# Patient Record
Sex: Female | Born: 1965 | Race: Black or African American | Hispanic: No
Health system: Southern US, Community
[De-identification: ages and names within clinical notes are randomized; demographics above are authoritative.]

## PROBLEM LIST (undated history)

## (undated) DIAGNOSIS — E161 Other hypoglycemia: Secondary | ICD-10-CM

## (undated) DIAGNOSIS — F410 Panic disorder [episodic paroxysmal anxiety] without agoraphobia: Secondary | ICD-10-CM

## (undated) DIAGNOSIS — N83209 Unspecified ovarian cyst, unspecified side: Secondary | ICD-10-CM

---

## 1998-03-10 ENCOUNTER — Inpatient Hospital Stay (HOSPITAL_COMMUNITY): Admission: AD | Admit: 1998-03-10 | Discharge: 1998-03-10 | Payer: Self-pay | Admitting: Obstetrics and Gynecology

## 1999-01-01 ENCOUNTER — Other Ambulatory Visit: Admission: RE | Admit: 1999-01-01 | Discharge: 1999-01-01 | Payer: Self-pay | Admitting: Obstetrics and Gynecology

## 1999-08-12 ENCOUNTER — Inpatient Hospital Stay (HOSPITAL_COMMUNITY): Admission: AD | Admit: 1999-08-12 | Discharge: 1999-08-12 | Payer: Self-pay | Admitting: Obstetrics and Gynecology

## 1999-09-16 ENCOUNTER — Emergency Department (HOSPITAL_COMMUNITY): Admission: EM | Admit: 1999-09-16 | Discharge: 1999-09-16 | Payer: Self-pay | Admitting: Emergency Medicine

## 2000-05-12 ENCOUNTER — Other Ambulatory Visit: Admission: RE | Admit: 2000-05-12 | Discharge: 2000-05-12 | Payer: Self-pay | Admitting: Obstetrics and Gynecology

## 2000-07-06 ENCOUNTER — Encounter: Payer: Self-pay | Admitting: Obstetrics and Gynecology

## 2000-07-06 ENCOUNTER — Inpatient Hospital Stay (HOSPITAL_COMMUNITY): Admission: AD | Admit: 2000-07-06 | Discharge: 2000-07-06 | Payer: Self-pay | Admitting: Obstetrics and Gynecology

## 2001-01-28 ENCOUNTER — Emergency Department (HOSPITAL_COMMUNITY): Admission: EM | Admit: 2001-01-28 | Discharge: 2001-01-28 | Payer: Self-pay | Admitting: Emergency Medicine

## 2001-04-22 ENCOUNTER — Inpatient Hospital Stay (HOSPITAL_COMMUNITY): Admission: AD | Admit: 2001-04-22 | Discharge: 2001-04-22 | Payer: Self-pay | Admitting: Obstetrics and Gynecology

## 2001-08-18 ENCOUNTER — Encounter: Admission: RE | Admit: 2001-08-18 | Discharge: 2001-08-18 | Payer: Self-pay | Admitting: Internal Medicine

## 2001-08-18 ENCOUNTER — Encounter: Payer: Self-pay | Admitting: Internal Medicine

## 2001-09-20 ENCOUNTER — Encounter (INDEPENDENT_AMBULATORY_CARE_PROVIDER_SITE_OTHER): Payer: Self-pay | Admitting: Specialist

## 2001-09-20 ENCOUNTER — Inpatient Hospital Stay (HOSPITAL_COMMUNITY): Admission: RE | Admit: 2001-09-20 | Discharge: 2001-09-22 | Payer: Self-pay | Admitting: Obstetrics and Gynecology

## 2002-07-20 ENCOUNTER — Encounter: Payer: Self-pay | Admitting: Obstetrics and Gynecology

## 2002-07-20 ENCOUNTER — Encounter: Admission: RE | Admit: 2002-07-20 | Discharge: 2002-07-20 | Payer: Self-pay | Admitting: Obstetrics and Gynecology

## 2005-10-28 ENCOUNTER — Emergency Department (HOSPITAL_COMMUNITY): Admission: EM | Admit: 2005-10-28 | Discharge: 2005-10-28 | Payer: Self-pay | Admitting: Emergency Medicine

## 2005-11-03 ENCOUNTER — Encounter: Admission: RE | Admit: 2005-11-03 | Discharge: 2005-11-03 | Payer: Self-pay | Admitting: Internal Medicine

## 2005-11-13 ENCOUNTER — Encounter: Admission: RE | Admit: 2005-11-13 | Discharge: 2005-11-13 | Payer: Self-pay | Admitting: Internal Medicine

## 2006-01-03 ENCOUNTER — Emergency Department (HOSPITAL_COMMUNITY): Admission: EM | Admit: 2006-01-03 | Discharge: 2006-01-03 | Payer: Self-pay | Admitting: Emergency Medicine

## 2006-03-26 ENCOUNTER — Emergency Department (HOSPITAL_COMMUNITY): Admission: EM | Admit: 2006-03-26 | Discharge: 2006-03-26 | Payer: Self-pay | Admitting: Emergency Medicine

## 2006-07-22 ENCOUNTER — Encounter: Admission: RE | Admit: 2006-07-22 | Discharge: 2006-07-22 | Payer: Self-pay | Admitting: Internal Medicine

## 2008-02-17 ENCOUNTER — Ambulatory Visit (HOSPITAL_COMMUNITY): Admission: RE | Admit: 2008-02-17 | Discharge: 2008-02-17 | Payer: Self-pay | Admitting: Obstetrics

## 2008-02-22 ENCOUNTER — Encounter: Admission: RE | Admit: 2008-02-22 | Discharge: 2008-02-22 | Payer: Self-pay | Admitting: Internal Medicine

## 2009-03-19 ENCOUNTER — Emergency Department (HOSPITAL_COMMUNITY): Admission: EM | Admit: 2009-03-19 | Discharge: 2009-03-19 | Payer: Self-pay | Admitting: Emergency Medicine

## 2009-03-25 ENCOUNTER — Emergency Department (HOSPITAL_COMMUNITY): Admission: EM | Admit: 2009-03-25 | Discharge: 2009-03-25 | Payer: Self-pay | Admitting: Emergency Medicine

## 2009-05-15 ENCOUNTER — Ambulatory Visit: Payer: Self-pay | Admitting: Family Medicine

## 2009-05-21 ENCOUNTER — Ambulatory Visit: Payer: Self-pay | Admitting: Family Medicine

## 2009-11-29 ENCOUNTER — Ambulatory Visit: Payer: Self-pay | Admitting: Family Medicine

## 2009-12-20 ENCOUNTER — Emergency Department: Payer: Self-pay | Admitting: Emergency Medicine

## 2010-04-11 ENCOUNTER — Ambulatory Visit: Payer: Self-pay | Admitting: Family Medicine

## 2010-04-11 DIAGNOSIS — R809 Proteinuria, unspecified: Secondary | ICD-10-CM | POA: Insufficient documentation

## 2010-04-11 DIAGNOSIS — T7421XA Adult sexual abuse, confirmed, initial encounter: Secondary | ICD-10-CM

## 2010-04-11 DIAGNOSIS — F4323 Adjustment disorder with mixed anxiety and depressed mood: Secondary | ICD-10-CM

## 2010-04-11 DIAGNOSIS — I10 Essential (primary) hypertension: Secondary | ICD-10-CM | POA: Insufficient documentation

## 2010-04-11 DIAGNOSIS — F411 Generalized anxiety disorder: Secondary | ICD-10-CM

## 2010-04-12 ENCOUNTER — Encounter: Payer: Self-pay | Admitting: Family Medicine

## 2010-04-13 ENCOUNTER — Telehealth: Payer: Self-pay | Admitting: Family Medicine

## 2010-04-13 ENCOUNTER — Encounter: Payer: Self-pay | Admitting: Family Medicine

## 2010-04-15 ENCOUNTER — Encounter: Payer: Self-pay | Admitting: Family Medicine

## 2010-04-16 ENCOUNTER — Encounter (INDEPENDENT_AMBULATORY_CARE_PROVIDER_SITE_OTHER): Payer: Self-pay

## 2010-04-16 ENCOUNTER — Encounter: Payer: Self-pay | Admitting: Family Medicine

## 2010-05-23 ENCOUNTER — Ambulatory Visit: Payer: Self-pay | Admitting: Family Medicine

## 2010-06-01 ENCOUNTER — Ambulatory Visit: Admit: 2010-06-01 | Payer: Self-pay | Admitting: Family Medicine

## 2010-06-29 LAB — CONVERTED CEMR LAB
AST: 14 units/L (ref 0–37)
Albumin: 4.3 g/dL (ref 3.5–5.2)
CO2: 20 meq/L (ref 19–32)
Calcium: 9.4 mg/dL (ref 8.4–10.5)
Cholesterol: 209 mg/dL — ABNORMAL HIGH (ref 0–200)
HDL: 45 mg/dL (ref >39)
Lymphocytes Relative: 42 % (ref 12–46)
Lymphs Abs: 2.8 10*3/uL (ref 0.7–4.0)
MCHC: 33.2 g/dL (ref 30.0–36.0)
Monocytes Absolute: 0.5 10*3/uL (ref 0.1–1.0)
Monocytes Relative: 7 % (ref 3–12)
Neutro Abs: 3.1 10*3/uL (ref 1.7–7.7)
Neutrophils Relative %: 47 % (ref 43–77)
Platelets: 250 10*3/uL (ref 150–400)
RBC: 5.18 M/uL — ABNORMAL HIGH (ref 3.87–5.11)
RDW: 13.9 % (ref 11.5–15.5)
TSH: 1.645 microintl units/mL (ref 0.350–4.500)
Triglycerides: 77 mg/dL (ref <150)
Vit D, 25-Hydroxy: 20 ng/mL — ABNORMAL LOW (ref 30–89)
WBC: 6.6 10*3/uL (ref 4.0–10.5)

## 2010-07-01 NOTE — Progress Notes (Signed)
Summary: CLINICAL LIST UPDATE      New Allergies: ! AMOXICILLIN ! BIAXIN ! CELEXA New Allergies: ! AMOXICILLIN ! BIAXIN ! CELEXA
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
## 2010-07-01 NOTE — Letter (Signed)
Summary: Handout Printed  Printed Handout:  - Depression, Adolescent and Adult 

## 2010-07-01 NOTE — Letter (Signed)
Summary: Out of Work  [facility]  [address]   [place], [place] [address]   Phone: [phone]  Fax: [phone]    April 11, 2010   Employee:  AUDRYANA HOCKENBERRY    To Whom It May Concern:   For Medical reasons, please excuse the above named employee from work for the following dates:  Start:   April 11, 2010  End:   April 15, 2010  If you need additional information, please feel free to contact our office.         Sincerely,    Standley Dakins MD  Appended Document: Out of Work A representative from the patient's place of employment called to verify that the patient had been seen today and that we had authorized, prepared and faxed the note from the clinic.   He was told that the note indeed was sent from this clinic after the patient had been seen.  Rodney Langton, M.D., F.A.A.F.P.

## 2010-07-01 NOTE — Progress Notes (Signed)
Summary: medical records  medical records   Imported By: Dorna Leitz 04/15/2010 17:06:50  _____________________________________________________________________  External Attachment:    Type:   Image     Comment:   External Document

## 2010-07-01 NOTE — Miscellaneous (Signed)
  Clinical Lists Changes  Medications: Added new medication of CLEOCIN 300 MG CAPS (CLINDAMYCIN HCL) take 1 by mouth every 12 hours - Signed Removed medication of SULFAMETHOXAZOLE-TMP DS 800-160 MG TABS (SULFAMETHOXAZOLE-TRIMETHOPRIM) take 1 by mouth two times a day until completed Rx of CLEOCIN 300 MG CAPS (CLINDAMYCIN HCL) take 1 by mouth every 12 hours;  #14 x 0;  Signed;  Entered by: Standley Dakins MD;  Authorized by: Standley Dakins MD;  Method used: Electronically to [facility]. [phone]*, [address], [place], [place], [place]  [address], Ph: [phone], Fax: [phone] Allergies: Changed allergy or adverse reaction from AMOXICILLIN to AMOXICILLIN    Prescriptions: CLEOCIN 300 MG CAPS (CLINDAMYCIN HCL) take 1 by mouth every 12 hours  #14 x 0   Entered and Authorized by:   Standley Dakins MD   Signed by:   Standley Dakins MD on 04/13/2010   Method used:   Electronically to        [facility] [phone]* (retail)       [address]       [place], [place]  [address]       Ph: [phone]       Fax: [phone]   RxID:   [id number]

## 2010-07-01 NOTE — Letter (Signed)
Summary: Out of Work  [facility]  [address]   [place], [place] [address]   Phone: [phone]  Fax: [phone]    April 16, 2010   Employee:  SHARLYNE KOENEMAN    To Whom It May Concern:   For Medical reasons, please excuse the above named employee from work for the following dates:  Start:04/16/2010    End:04/21/2010    If you need additional information, please feel free to contact our office.         Sincerely,    Levonne Spiller EMT-P  Appended Document: Out of Work patient in for recheck of blood pressure bp 134/74;pulse 64 states she feels better

## 2010-07-01 NOTE — Letter (Signed)
Summary: OUT OF WORK  OUT OF WORK   Imported By: Rosine Beat 04/16/2010 16:41:40  _____________________________________________________________________  External Attachment:    Type:   Image     Comment:   External Document

## 2010-07-16 ENCOUNTER — Telehealth: Payer: Self-pay | Admitting: Family Medicine

## 2010-07-18 ENCOUNTER — Encounter: Payer: Self-pay | Admitting: Internal Medicine

## 2010-07-18 ENCOUNTER — Ambulatory Visit: Payer: Self-pay | Admitting: Internal Medicine

## 2010-07-18 DIAGNOSIS — G44229 Chronic tension-type headache, not intractable: Secondary | ICD-10-CM | POA: Insufficient documentation

## 2010-07-18 DIAGNOSIS — G44209 Tension-type headache, unspecified, not intractable: Secondary | ICD-10-CM

## 2010-07-23 NOTE — Progress Notes (Signed)
  Phone Note Call from Patient   Summary of Call: Pt reporting that she is having a recurrence of the exact same symptoms she had when she had a UTI several months ago.  She is requesting an RX be called in to [facility].  Initial call taken by: Dorna Leitz  Follow-up for Phone Call        Will call in RX this time only but patient must be advised that she will need to come in to have urine tested in the future and if this doesn't clear up her symptoms.   Follow-up by: Standley Dakins MD,  July 16, 2010 4:21 PM  Additional Follow-up for Phone Call Additional follow up Details #1::        The patient verbalized clear understanding of above.   Additional Follow-up by: Dorna Leitz    New/Updated Medications: CLINDAMYCIN HCL 300 MG CAPS (CLINDAMYCIN HCL) take 1 by mouth two times a day until completed Prescriptions: CLINDAMYCIN HCL 300 MG CAPS (CLINDAMYCIN HCL) take 1 by mouth two times a day until completed  #14 x 0   Entered and Authorized by:   Standley Dakins MD   Signed by:   Standley Dakins MD on 07/16/2010   Method used:   Faxed to ...       [facility] (retail)       [address]       [place], [place]    [place]       Ph: [phone]       Fax: [phone]   RxID:   [id number]  The patient says that she tolerated the clindamycin prescription given to her in the past well with no side effects.  Rodney Langton, MD, CDE, [employer]
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
## 2010-08-24 ENCOUNTER — Emergency Department: Payer: Self-pay | Admitting: Emergency Medicine

## 2010-10-17 NOTE — Discharge Summary (Signed)
[facility]  Patient:    Judith Fischer, FATE [facility] Visit Number: [id number] MRN: [id number]          Service Type: GYN Location: 9300 [id number] Attending Physician:  Leonard Schwartz Dictated by:   Henreitta Leber, P.A. Admit Date:  09/20/2001 Discharge Date: 09/22/2001                             Discharge Summary  DISCHARGE DIAGNOSES:          1. Fibroid uterus.                               2. Endometriosis.                               3. Dysmenorrhea.                               4. Dyspareunia.                               5. Menorrhagia.                               6. Anemia.                               7. Pelvic adhesions.                               8. Obesity (weight 221 pounds).  PROCEDURES:                   On the date of admission, the patient underwent a total abdominal hysterectomy with lysis of adhesions and drainage of a left ovarian cyst tolerating all procedures well.  HISTORY OF PRESENT ILLNESS:   Ms. Judith Fischer is a 45 year old female, gravida 0, who presents for a total abdominal hysterectomy because of menorrhagia, dysmenorrhea, fibroids, and severe anemia (hemoglobin as low as 8.8).  Please see patients dictated History & Physical examination for details.  PHYSICAL EXAMINATION:  VITAL SIGNS:                  Weight 221 pounds, height 5 feet 5 inches tall.  GENERAL:                      Exam within normal limits.  PELVIC:                       EG/BUS within normal limits.  Vagina is normal except for menstrual blood.  Cervix is nontender.  Uterus is approximately 12-weeks size.  Adnexa without masses.  Rectovaginal exam confirms.  HOSPITAL COURSE:              On the date of admission, the patient underwent aforementioned procedures tolerating them all well.  The patient was found to have a multiple fibroid uterus measuring 10 to 12-week size, pelvice adhesions, implants consistent with endometriosis, and left  ovarian corpus luteum cyst which was drained along with mobile-appearing left tube and  right tube with ovary.  Postoperative course was unremarkable with patient resuming bowel and bladder function by postop day #2 and being ready for discharge home.  Postop hemoglobin 9.1, preop hemoglobin 9.5.  DISCHARGE MEDICATIONS:        1. Vicodin 1 to 2 tablets every 4 to 6 hours                                  as needed for pain.                               2. Ibuprofen 600 mg 1 tablet with food every                                  6 hours for 5 days, then as needed.                               3. Phenergan 25 mg 1 tablet 4 times daily if                                  needed for nausea.                               4. Iron 325 mg 1 tablet twice daily for                                  6 weeks.                               5. Stool softeners 100 mg 1 tablet twice daily                                  until bowel movements are regular.  FOLLOWUP:                     The patient is to call [facility]cology for an appointment to have staples removed on September 26, 2001.  She is also at that time to schedule a 6-week postoperative exam with Dr. Leonard Schwartz.  DISCHARGE INSTRUCTIONS:       The patient was given a copy of [facility] postoperative instruction sheet.  She was further advised to avoid driving for two weeks, heavy lifting for four weeks, and intercourse for six weeks.  FINAL PATHOLOGY:              1) Right pelvic biopsy: Endometriosis, no evidence of malignancy.  2) Anterior cul-de-sac biopsy: Endometriosis, no evidence of malignancy.  3) Uterus and Cervix: Squamous metaplasia, no dysplasia identified.  Prolific endometrium, no evidence of hyperplasia or malignancy.  Leiomyomata of mucosal intramural and subserosal, uterine serosal fibrous adhesions.  No endometriosis or malignancy identified. Dictated  by:   Henreitta Leber, P.A. Attending Physician:  Leonard Schwartz DD:  10/11/01 TD:  10/13/01 Job: [id number]

## 2010-10-17 NOTE — H&P (Signed)
[facility]  Patient:    Judith Fischer, Judith Fischer [facility] Visit Number: [id number] MRN: [id number]          Service Type: Attending:  Janine Limbo, M.D. Dictated by:   Janine Limbo, M.D. Adm. Date:  09/20/01   CC:         Theressa Millard, M.D.   History and Physical  HISTORY OF PRESENT ILLNESS:   Ms. Sopheap Basic is a 45 year old female, gravida 0, who presents for a total abdominal hysterectomy because of menorrhagia, dysmenorrhea, fibroids, and severe anemia (hemoglobin as low as 8.8).  The patient has been followed at [facility] for many years with the above-mentioned complaints.  The patient had a diagnostic laparoscopy in May of 1996 and a myomectomy in September of 1996.  She was found to have fibroids as well as endometriosis.  She has been treated with hormonal therapy, nonsteroidal anti-inflammatory agents, and narcotic medications.  A repeat ultrasound has demonstrated that the fibroids have returned and that the uterus measures approximately 12.7 cm by 7.2 cm.  The ovaries appear normal.  The patients discomfort has progressed to the point that she has trouble working and performing her normal functions.  In addition, she suffers from pelvic pain and dyspareunia.  She is ready at this time to proceed with definitive therapy.  PAST MEDICAL HISTORY:         The patient denies hypertension and diabetes.  CURRENT MEDICATIONS:          Iron.  SOCIAL HISTORY:               The patient denies cigarette use, alcohol use, and recreational drug use.  ALLERGIES:                    None known.  REVIEW OF SYSTEMS:            Please see history of present illness.  FAMILY HISTORY:               Noncontributory.  PHYSICAL EXAMINATION:  VITAL SIGNS:                  Weight is 221 pounds.  Height is 5 feet 5 inches.  HEENT:                        Within normal limits.  CHEST:                         Clear.  HEART:                        Regular rate and rhythm.  BREASTS:                      Without masses.  ABDOMEN:                      Nontender.  EXTREMITIES:                  Within normal limits.  NEUROLOGICAL:                 Grossly normal.  PELVIC:                       External genitalia is normal.  Vagina is normal except for menstrual blood.  Cervix  is nontender.  Uterus is approximately 12 weeks size.  Adnexa shows no masses.  Rectovaginal exam confirms.  ASSESSMENT:                   1. Fibroid uterus.                               2. Endometriosis.                               3. Dysmenorrhea.                               4. Menorrhagia.                               5. Anemia.                               6. Dyspareunia.                               7. Obesity (weight 221 pounds).                               8. History of prior myomectomy.  PLAN:                         The patient has decided to proceed with definitive therapy.  We discussed alternative therapies including Lupron management, myomectomy, uterine artery embolization, uterine fibroid ablation, and possible vaginal hysterectomy.  She declined those options after carefully considering the risks and benefits.  She wants to proceed at this point with abdominal hysterectomy.  She understands the indications for her procedure and she accepts the risks of, but not limited to, anesthetic complications, bleeding, infections, and possible damage to the surrounding organs.Dictated by:   Janine Limbo, M.D. Attending:  Janine Limbo, M.D. DD:  09/18/01 TD:  09/18/01 Job: [id number]

## 2010-10-17 NOTE — Op Note (Signed)
[facility]  Patient:    Judith Fischer, Judith Fischer [facility] Visit Number: [id number] MRN: [id number]          Service Type: GYN Location: 9300 [id number] Attending Physician:  Leonard Schwartz Dictated by:   Janine Limbo, M.D. Proc. Date: 09/20/01 Admit Date:  09/20/2001   CC:         Theressa Millard, M.D.   Operative Report  PREOPERATIVE DIAGNOSES:       1. Fibroid uterus.                               2. Endometriosis.                               3. Dysmenorrhea.                               4. Menorrhagia.                               5. Anemia.                               6. Dyspareunia.                               7. Obesity (weight 221 pounds, height is 5 feet                                  5 inches).                               8. History of a prior myomectomy.  POSTOPERATIVE DIAGNOSES:      1. Fibroid uterus.                               2. Endometriosis.                               3. Dysmenorrhea.                               4. Menorrhagia.                               5. Anemia.                               6. Dyspareunia.                               7. Obesity (weight 221 pounds, height is 5 feet                                  5 inches).  8. History of a prior myomectomy.                               9. Adhesions.  PROCEDURE:                    Total abdominal hysterectomy with drainage of a left ovarian cyst.  SURGEON:                      Janine Limbo, M.D.  FIRST ASSISTANT:              Henreitta Leber, P.A.  ANESTHESIA:                   General.  INDICATIONS:                  The patient is a 45 year old female, gravida 0, who presents with the above-mentioned diagnoses.  Her discomfort has progressed to the point that she is unable to perform her duties at home and at work.  She wants to proceed with hysterectomy.  She understands the indications for her procedure  and she accepts the risks of (but not limited to) anesthetic complications, bleeding, infection and possible damage to surrounding organs.  The patient has requested that we perform her procedure abdominally rather than vaginally.  FINDINGS:                     The uterus was 12 weeks in size and contained multiple subserosal and intramural fibroids.  The left fallopian tube was scarred to the left ovary.  The left ovarian complex was adherent to the left posterior uterus.  There were filmy adhesions between the bowel and the posterior uterus.  The right fallopian tube and right ovary appeared normal. There were hyperpigmented lesions in the anterior cul-de-sac measuring less than 0.5 cm in size.  These were consistent with implants of endometriosis. The bowel and remaining abdominal contents appeared normal.  There was a corpus luteum cyst present on the left ovary.  DESCRIPTION OF PROCEDURE:     The patient was taken to the operating room, where a general anesthetic was given.  The patients abdomen, perineum and vagina were prepped with multiple layers of Betadine.  A Foley catheter was placed in the bladder.  The patient was sterilely draped.  A low transverse incision was made in the abdomen and carried sharply through the subcutaneous tissue, the fascia and the anterior peritoneum.  The pelvic structures were identified with findings as mentioned above.  The adhesions in the pelvis were lysed so that the anatomy could be reapproximated to a normal status.  An abdominal wall retractor was placed.  The bowel was packed cephalad.  The ureters were identified bilaterally and followed throughout their course in the pelvis.  They were felt to be away from our surgical areas.  The round ligaments were isolated bilaterally, cut, suture ligated and held to the side. A bladder flap was developed anteriorly.  The left upper pedicle was isolated, clamped, cut, sutured and tied securely.  An  identical procedure was carried out on the opposite side.  The uterine vessels were skeletonized bilaterally. Alternating from left to right, the uterine arteries, parametrial tissues, paracervical tissues and the vaginal angles were cut, sutured and tied securely.  The cervix was then transected from the upper vagina.  The uterus was removed and pictures were taken.  The vaginal cuff was then closed using figure-of-eight sutures.  The uterosacral ligaments were tied to the vaginal angles.  Hemostasis was achieved using figure-of-eight sutures of 3-0 Vicryl. The pelvis was then vigorously irrigated.  Hemostasis was thought to be adequate.  The cyst in the left ovary was opened and it was found to be a corpus luteum.  Figure-of-eight sutures of 3-0 Vicryl were placed for hemostasis.  A final check was made for hemostasis, and hemostasis was adequate throughout.  The instruments were removed.  The anterior peritoneum and the abdominal musculature were reapproximated in the midline using 3-0 Vicryl.  The fascia was closed using a running suture of 0 Vicryl, followed by three interrupted sutures of 0 Vicryl.  The subcutaneous layer was irrigated. Hemostasis was adequate.  The subcutaneous layer was closed using a running suture of 3-0 Vicryl.  The skin was reapproximated using skin staples.  The suture material used except where otherwise mentioned was 0 Vicryl.  Sponge, needle and instrument counts were correct on two occasions.  Estimated blood loss was 150 cc.  Estimated urine output was 650 cc.  The patients urine was noted to be clear.  The patient was awakened from her anesthetic and taken to the recovery room in stable condition. Dictated by:   Janine Limbo, M.D. Attending Physician:  Leonard Schwartz DD:  09/20/01 TD:  09/20/01 Job: [id number]

## 2011-08-19 ENCOUNTER — Encounter (HOSPITAL_COMMUNITY): Payer: Self-pay

## 2011-08-19 ENCOUNTER — Emergency Department (HOSPITAL_COMMUNITY)
Admission: EM | Admit: 2011-08-19 | Discharge: 2011-08-19 | Disposition: A | Discharge status: Home / Self Care | Payer: BC Managed Care – PPO | Attending: Emergency Medicine | Admitting: Emergency Medicine

## 2011-08-19 DIAGNOSIS — R197 Diarrhea, unspecified: Secondary | ICD-10-CM | POA: Insufficient documentation

## 2011-08-19 DIAGNOSIS — N390 Urinary tract infection, site not specified: Secondary | ICD-10-CM

## 2011-08-19 DIAGNOSIS — B9789 Other viral agents as the cause of diseases classified elsewhere: Secondary | ICD-10-CM | POA: Insufficient documentation

## 2011-08-19 DIAGNOSIS — R112 Nausea with vomiting, unspecified: Secondary | ICD-10-CM

## 2011-08-19 DIAGNOSIS — B349 Viral infection, unspecified: Secondary | ICD-10-CM

## 2011-08-19 HISTORY — DX: Unspecified ovarian cyst, unspecified side: N83.209

## 2011-08-19 HISTORY — DX: Panic disorder (episodic paroxysmal anxiety): F41.0

## 2011-08-19 HISTORY — DX: Other hypoglycemia: E16.1

## 2011-08-19 LAB — URINALYSIS, ROUTINE W REFLEX MICROSCOPIC
Bilirubin Urine: NEGATIVE
Glucose, UA: NEGATIVE mg/dL
Hgb urine dipstick: NEGATIVE
Ketones, ur: NEGATIVE mg/dL
Nitrite: NEGATIVE
Protein, ur: 30 mg/dL — AB
Specific Gravity, Urine: 1.029 (ref 1.005–1.030)
Urobilinogen, UA: 1 mg/dL (ref 0.0–1.0)
pH: 6.5 (ref 5.0–8.0)

## 2011-08-19 LAB — POCT I-STAT, CHEM 8
Calcium, Ion: 1.15 mmol/L (ref 1.12–1.32)
Chloride: 105 mEq/L (ref 96–112)
Glucose, Bld: 123 mg/dL — ABNORMAL HIGH (ref 70–99)
Potassium: 4 mEq/L (ref 3.5–5.1)
TCO2: 24 mmol/L (ref 0–100)

## 2011-08-19 LAB — URINE MICROSCOPIC-ADD ON

## 2011-08-19 MED ORDER — ONDANSETRON HCL 4 MG/2ML IJ SOLN: 4.0000 mg | Freq: Once | INTRAMUSCULAR | Status: DC

## 2011-08-19 MED ORDER — FAMOTIDINE 20 MG PO TABS: 20.0000 mg | ORAL_TABLET | Freq: Once | ORAL | Status: DC

## 2011-08-19 MED ORDER — SODIUM CHLORIDE 0.9 % IV BOLUS (SEPSIS)
1000.0000 mL | Freq: Once | INTRAVENOUS | Status: AC
  Administered 2011-08-19: 500 mL via INTRAVENOUS

## 2011-08-19 MED ORDER — ONDANSETRON HCL 4 MG/2ML IJ SOLN
4.0000 mg | Freq: Once | INTRAMUSCULAR | Status: AC
  Administered 2011-08-19: 4 mg via INTRAVENOUS
  Filled 2011-08-19: qty 2

## 2011-08-19 MED ORDER — SODIUM CHLORIDE 0.9 % IV SOLN
1000.0000 mL | INTRAVENOUS | Status: DC
  Administered 2011-08-19: 1000 mL via INTRAVENOUS

## 2011-08-19 MED ORDER — NITROFURANTOIN MONOHYD MACRO 100 MG PO CAPS: 100.0000 mg | ORAL_CAPSULE | Freq: Two times a day (BID) | ORAL | Status: AC

## 2011-08-19 MED ORDER — PROMETHAZINE HCL 25 MG/ML IJ SOLN
12.5000 mg | Freq: Once | INTRAMUSCULAR | Status: AC
  Administered 2011-08-19: 25 mg via INTRAVENOUS
  Filled 2011-08-19: qty 1

## 2011-08-19 MED ORDER — PREDNISONE 20 MG PO TABS: 60.0000 mg | ORAL_TABLET | Freq: Once | ORAL | Status: DC

## 2011-08-19 MED ORDER — PROMETHAZINE HCL 25 MG PO TABS: 25.0000 mg | ORAL_TABLET | Freq: Four times a day (QID) | ORAL | Status: AC | PRN

## 2011-08-19 MED ORDER — DIPHENHYDRAMINE HCL 50 MG/ML IJ SOLN: 50.0000 mg | Freq: Once | INTRAMUSCULAR | Status: DC

## 2011-08-19 NOTE — ED Provider Notes (Signed)
History     CSN: [id number]  Arrival date & time 08/19/11  0101   First MD Initiated Contact with Patient 08/19/11 [phone]      Chief Complaint  Patient presents with  . Emesis     Patient is a 46 y.o. female presenting with vomiting. The history is provided by the patient.  Emesis  This is a new problem. The current episode started 6 to 12 hours ago. The problem occurs 5 to 10 times per day. The problem has been gradually worsening. The emesis has an appearance of stomach contents. There has been no fever. Associated symptoms include diarrhea. Pertinent negatives include no abdominal pain, no chills and no fever.   patient reports onset of nausea vomiting and diarrhea at approximately 9:30 last evening. States that she has had approximately 7 episodes of vomiting and 4 episodes of diarrhea since onset of symptoms.. Patient denies significant abdominal pain or known fever. She also denies cough shortness of breath chest pain or other symptoms. Patient also reports that her urine has had a very foul-smelling to that for 2-3 weeks. She states that she has been attempting to drink lots of cranberry juice as she has been suspicious that she may have a UTI.  Past Medical History  Diagnosis Date  . Panic attacks   . Hypoglycemic reaction   . Ovarian cyst     History reviewed. No pertinent past surgical history.  History reviewed. No pertinent family history.  History  Substance Use Topics  . Smoking status: Not on file  . Smokeless tobacco: Not on file  . Alcohol Use: No    OB History    Grav Para Term Preterm Abortions TAB SAB Ect Mult Living                  Review of Systems  Constitutional: Negative.  Negative for fever and chills.  HENT: Negative.   Eyes: Negative.   Respiratory: Negative.   Cardiovascular: Negative.   Gastrointestinal: Positive for vomiting and diarrhea. Negative for abdominal pain.  Genitourinary: Negative.   Musculoskeletal: Negative.   Skin:  Negative.   Neurological: Negative.   Hematological: Negative.   Psychiatric/Behavioral: Negative.     Allergies  Amoxicillin; Citalopram hydrobromide; and Clarithromycin  Home Medications  No current outpatient prescriptions on file.  BP 145/79  Pulse 81  Temp(Src) 98.9 F (37.2 C) (Oral)  Resp 16  SpO2 99%  Physical Exam  Constitutional: She is oriented to person, place, and time. She appears well-developed and well-nourished.  HENT:  Head: Normocephalic and atraumatic.  Eyes: Conjunctivae are normal.  Neck: Neck supple.  Cardiovascular: Normal rate and regular rhythm.   Pulmonary/Chest: Effort normal and breath sounds normal.  Abdominal: Soft. Bowel sounds are normal.  Musculoskeletal: Normal range of motion.  Neurological: She is alert and oriented to person, place, and time.  Skin: Skin is warm and dry. No erythema.  Psychiatric: She has a normal mood and affect.    ED Course  Procedures   Pt feeling much better after IVF's and medication. No further N/V/D. Findings and clinical impression discussed w/ pt. Will plan for d/c home w/ tx for UTI and short course of med for nausea and encourage pt to f/u w/ PCP. Referrals provided. Pt agreeable w/ plan.  Labs Reviewed  URINALYSIS, ROUTINE W REFLEX MICROSCOPIC - Abnormal; Notable for the following:    APPearance CLOUDY (*)    Protein, ur 30 (*)    Leukocytes, UA SMALL (*)  All other components within normal limits  POCT I-STAT, CHEM 8 - Abnormal; Notable for the following:    Glucose, Bld 123 (*)    Hemoglobin 16.3 (*)    HCT 48.0 (*)    All other components within normal limits  URINE MICROSCOPIC-ADD ON - Abnormal; Notable for the following:    Squamous Epithelial / LPF MANY (*)    Bacteria, UA FEW (*)    All other components within normal limits   No results found.   No diagnosis found.    MDM  HPI/PE and clinical findings c/w 1. N/V/D (Likely viral, chemistry panel WNL, pt feeling better and there  have been no further episodes of N/V/D, pt denies focal abd pain, tolerating PO fluids at d/c). 2. UTI        Leanne Chang, NP 08/22/11 0725  Roma Kayser Schorr, NP 08/22/11 0725   Medical screening examination/treatment/procedure(s) were performed by non-physician practitioner and as supervising physician I was immediately available for consultation/collaboration.  Sunnie Nielsen, MD 08/23/11 [phone]

## 2011-08-19 NOTE — Discharge Instructions (Signed)
Please review the instructions below. You were seen in the emergency department tonight for your onset of nausea, vomiting and diarrhea. You received IV fluids and medication and admit to feeling better. Your blood work was normal. The likely source of your nausea, vomiting and diarrhea is a viral syndrome. Your urine test does show that you have a urinary tract infection. We are treating this infection with an antibiotic. Please take as directed and be sure to complete the antibiotic. We have also prescribed a short course of medication for nausea. Please take as directed. If your diarrhea persist you can get over-the-counter Immodium and take as directed. We have provided the "[facility]" number above and the resource list below, as discussed, to assist you in getting established with a primary care physician. Return if symptoms worsen, otherwise follow up with a primary care physician as soon as can be arranged     B.R.A.T. Diet Your doctor has recommended the B.R.A.T. diet for you or your child until the condition improves. This is often used to help control diarrhea and vomiting symptoms. If you or your child can tolerate clear liquids, you may have:  Bananas.   Rice.   Applesauce.   Toast (and other simple starches such as crackers, potatoes, noodles).  Be sure to avoid dairy products, meats, and fatty foods until symptoms are better. Fruit juices such as apple, grape, and prune juice can make diarrhea worse. Avoid these. Continue this diet for 2 days or as instructed by your caregiver. Document Released: 05/18/2005 Document Revised: 05/07/2011 Document Reviewed: 11/04/2006 .Nausea and Vomiting Nausea means you feel sick to your stomach. Throwing up (vomiting) is a reflex where stomach contents come out of your mouth. HOME CARE   Take medicine as told by your doctor.   Do not force yourself to eat. However, you do need to drink fluids.   If you feel like eating, eat a normal diet  as told by your doctor.   Eat rice, wheat, potatoes, bread, lean meats, yogurt, fruits, and vegetables.   Avoid high-fat foods.   Drink enough fluids to keep your pee (urine) clear or pale yellow.   Ask your doctor how to replace body fluid losses (rehydrate). Signs of body fluid loss (dehydration) include:   Feeling very thirsty.   Dry lips and mouth.   Feeling dizzy.   Dark pee.   Peeing less than normal.   Feeling confused.   Fast breathing or heart rate.  GET HELP RIGHT AWAY IF:   You have blood in your throw up.   You have black or bloody poop (stool).   You have a bad headache or stiff neck.   You feel confused.   You have bad belly (abdominal) pain.   You have chest pain or trouble breathing.   You do not pee at least once every 8 hours.   You have cold, clammy skin.   You keep throwing up after 24 to 48 hours.   You have a fever.  MAKE SURE YOU:   Understand these instructions.   Will watch your condition.   Will get help right away if you are not doing well or get worse.  Document Released: 11/04/2007 Document Revised: 05/07/2011 Document Reviewed: 10/17/2010 .Urinary Tract Infection A urinary tract infection (UTI) is often caused by a germ (bacteria). A UTI is usually helped with medicine (antibiotics) that kills germs. Take all the medicine until it is gone. Do this even if you are feeling better. You are  usually better in 7 to 10 days. HOME CARE   Drink enough water and fluids to keep your pee (urine) clear or pale yellow. Drink:   Cranberry juice.   Water.   Avoid:   Caffeine.   Tea.   Bubbly (carbonated) drinks.   Alcohol.   Only take medicine as told by your doctor.   To prevent further infections:   Pee often.   After pooping (bowel movement), women should wipe from front to back. Use each tissue only once.   Pee before and after having sex (intercourse).  Ask your doctor when your test results will be ready. Make sure  you follow up and get your test results.  GET HELP RIGHT AWAY IF:   There is very bad back pain or lower belly (abdominal) pain.   You get the chills.   You have a fever.   Your baby is older than 3 months with a rectal temperature of 102 F (38.9 C) or higher.   Your baby is 53 months old or younger with a rectal temperature of 100.4 F (38 C) or higher.   You feel sick to your stomach (nauseous) or throw up (vomit).   There is continued burning with peeing.   Your problems are not better in 3 days. Return sooner if you are getting worse.  MAKE SURE YOU:   Understand these instructions.   Will watch your condition.   Will get help right away if you are not doing well or get worse.  Document Released: 11/04/2007 Document Revised: 05/07/2011 Document Reviewed: 11/04/2007 Viral Syndrome You or your child has Viral Syndrome. It is the most common infection causing "colds" and infections in the nose, throat, sinuses, and breathing tubes. Sometimes the infection causes nausea, vomiting, or diarrhea. The germ that causes the infection is a virus. No antibiotic or other medicine will kill it. There are medicines that you can take to make you or your child more comfortable.  HOME CARE INSTRUCTIONS   Rest in bed until you start to feel better.   If you have diarrhea or vomiting, eat small amounts of crackers and toast. Soup is helpful.   Do not give aspirin or medicine that contains aspirin to children.   Only take over-the-counter or prescription medicines for pain, discomfort, or fever as directed by your caregiver.  SEEK IMMEDIATE MEDICAL CARE IF:   You or your child has not improved within one week.   You or your child has pain that is not at least partially relieved by over-the-counter medicine.   Thick, colored mucus or blood is coughed up.   Discharge from the nose becomes thick yellow or green.   Diarrhea or vomiting gets worse.   There is any major change in your or  your child's condition.   You or your child develops a skin rash, stiff neck, severe headache, or are unable to hold down food or fluid.   You or your child has an oral temperature above 102 F (38.9 C), not controlled by medicine.   Your baby is older than 3 months with a rectal temperature of 102 F (38.9 C) or higher.   Your baby is 48 months old or younger with a rectal temperature of 100.4 F (38 C) or higher.  Document Released: 05/03/2006 Document Revised: 05/07/2011 Document Reviewed: 05/04/2007 [facility] Patient Information 2012 [place], [place].

## 2011-08-19 NOTE — ED Notes (Signed)
Pt states that she started vomiting ad having diarrhea about 9:30 tonight,

## 2011-11-05 ENCOUNTER — Other Ambulatory Visit: Payer: Self-pay

## 2011-11-05 ENCOUNTER — Encounter (HOSPITAL_COMMUNITY): Payer: Self-pay | Admitting: *Deleted

## 2011-11-05 ENCOUNTER — Emergency Department (HOSPITAL_COMMUNITY)
Admission: EM | Admit: 2011-11-05 | Discharge: 2011-11-05 | Disposition: A | Discharge status: Home / Self Care | Payer: 59 | Attending: Emergency Medicine | Admitting: Emergency Medicine

## 2011-11-05 DIAGNOSIS — R079 Chest pain, unspecified: Secondary | ICD-10-CM | POA: Insufficient documentation

## 2011-11-05 DIAGNOSIS — M25519 Pain in unspecified shoulder: Secondary | ICD-10-CM | POA: Insufficient documentation

## 2011-11-05 DIAGNOSIS — M25512 Pain in left shoulder: Secondary | ICD-10-CM

## 2011-11-05 DIAGNOSIS — F410 Panic disorder [episodic paroxysmal anxiety] without agoraphobia: Secondary | ICD-10-CM | POA: Insufficient documentation

## 2011-11-05 MED ORDER — IBUPROFEN 800 MG PO TABS: 800.0000 mg | ORAL_TABLET | Freq: Three times a day (TID) | ORAL | Status: AC

## 2011-11-05 MED ORDER — CYCLOBENZAPRINE HCL 10 MG PO TABS: 10.0000 mg | ORAL_TABLET | Freq: Two times a day (BID) | ORAL | Status: AC | PRN

## 2011-11-05 NOTE — Discharge Instructions (Signed)

## 2011-11-05 NOTE — ED Notes (Signed)
Pt states she started doing push ups 2 weeks ago. Now L shoulder sore. ROM intact. Pt states pain is worse with mvmt. Pt states she had chest pain earlier, but it has resolved now. Pt admits to history of anxiety.

## 2011-11-05 NOTE — ED Notes (Signed)
Pt c/o left shoulder pain since Monday; worse today; radiating from neck down arm

## 2011-11-05 NOTE — ED Provider Notes (Signed)
History     CSN: [id number]  Arrival date & time 11/05/11  2009   First MD Initiated Contact with Patient 11/05/11 2238      Chief Complaint  Patient presents with  . Shoulder Pain    (Consider location/radiation/quality/duration/timing/severity/associated sxs/prior treatment) HPI  46 year old female with hx of anxiety and no prior history of heart disease presents complaining of left shoulder pain. Patient states for the past 4 days she has had intermittent bouts of pain to her left shoulder. Pain is described as a burning and sharp sensation worsening with turning her neck, or with arm movement.  Onset is gradual, intermittent, and alleviate with rest.  She also experienced 2 bouts of sharp chest pain to her midchest.  Pain lasting only seconds, non exertional, and resolved without intervention.  Denies fever, rash, numbness or weakness.  Denies recent trauma.  Does admits to start performing push up and also moving furniture the week before.  Pt has tried taking 800mg  of ibuprofen once yesterday which provides some relief.    Past Medical History  Diagnosis Date  . Panic attacks   . Hypoglycemic reaction   . Ovarian cyst     History reviewed. No pertinent past surgical history.  History reviewed. No pertinent family history.  History  Substance Use Topics  . Smoking status: Not on file  . Smokeless tobacco: Not on file  . Alcohol Use: No    OB History    Grav Para Term Preterm Abortions TAB SAB Ect Mult Living                  Review of Systems  Constitutional: Negative for fever.  Cardiovascular: Positive for chest pain.  Musculoskeletal: Negative for back pain and joint swelling.  Skin: Negative for rash.  Neurological: Negative for numbness.    Allergies  Amoxicillin; Celebrex; Citalopram hydrobromide; and Clarithromycin  Home Medications   Current Outpatient Rx  Name Route Sig Dispense Refill  . LORAZEPAM 2 MG PO TABS Oral Take 1-2 mg by mouth every 8  (eight) hours as needed. For anxiety.    . OMEPRAZOLE 20 MG PO CPDR Oral Take 20 mg by mouth daily.    . SERTRALINE HCL 100 MG PO TABS Oral Take 100 mg by mouth daily.      BP 143/84  Pulse 65  Temp(Src) 98.8 F (37.1 C) (Oral)  Resp 18  Wt 230 lb 8 oz (104.554 kg)  SpO2 100%  Physical Exam  Nursing note and vitals reviewed. Constitutional: She appears well-developed and well-nourished. No distress.  HENT:  Head: Normocephalic and atraumatic.  Eyes: Conjunctivae are normal.  Neck: Normal range of motion. Neck supple.  Cardiovascular: Normal rate and regular rhythm.  Exam reveals no gallop and no friction rub.   No murmur heard. Pulmonary/Chest: Effort normal and breath sounds normal. No respiratory distress. She exhibits no tenderness.  Abdominal: Soft. There is no tenderness.  Musculoskeletal: Normal range of motion.       L shoulder: Normal ROM, increasing pain to mid shoulder with shoulder abduction and shoulder rotation.  Normal strength, negative empty can test, sensation intact throughout with light touch, normal grip strength bilat.  No deformity noted.  Radial pulse 2+  Lymphadenopathy:    She has no cervical adenopathy.  Neurological: She is alert.  Skin: Skin is warm. No rash noted. She is not diaphoretic.    ED Course  Procedures (including critical care time)  Labs Reviewed - No data to display No  results found.   No diagnosis found.   Date: 11/05/2011  Rate: 56  Rhythm: normal sinus rhythm  QRS Axis: normal  Intervals: normal  ST/T Wave abnormalities: normal  Conduction Disutrbances:none  Narrative Interpretation:   Old EKG Reviewed: none available    MDM  L shoulder pain, likely musculoskeletal.  Normal ECG.  TIMI 0.  Reassurance given.  Will dc with NSAIDs and muscle relaxant.  Pt sts she also has a mammography yesterday and it was normal.          Fayrene Helper, PA-C 11/05/11 2328

## 2011-11-06 NOTE — ED Provider Notes (Signed)
Medical screening examination/treatment/procedure(s) were performed by non-physician practitioner and as supervising physician I was immediately available for consultation/collaboration.  Donnetta Hutching, MD 11/06/11 [phone]

## 2012-09-29 ENCOUNTER — Encounter: Payer: Self-pay | Admitting: Obstetrics

## 2013-08-09 ENCOUNTER — Encounter (HOSPITAL_COMMUNITY): Payer: Self-pay | Admitting: Emergency Medicine

## 2013-08-09 ENCOUNTER — Emergency Department (HOSPITAL_COMMUNITY)
Admission: EM | Admit: 2013-08-09 | Discharge: 2013-08-09 | Disposition: A | Discharge status: Home / Self Care | Payer: Managed Care, Other (non HMO) | Attending: Emergency Medicine | Admitting: Emergency Medicine

## 2013-08-09 ENCOUNTER — Emergency Department (HOSPITAL_COMMUNITY): Payer: Managed Care, Other (non HMO)

## 2013-08-09 DIAGNOSIS — R110 Nausea: Secondary | ICD-10-CM | POA: Insufficient documentation

## 2013-08-09 DIAGNOSIS — Z90710 Acquired absence of both cervix and uterus: Secondary | ICD-10-CM | POA: Insufficient documentation

## 2013-08-09 DIAGNOSIS — F410 Panic disorder [episodic paroxysmal anxiety] without agoraphobia: Secondary | ICD-10-CM | POA: Insufficient documentation

## 2013-08-09 DIAGNOSIS — N83209 Unspecified ovarian cyst, unspecified side: Secondary | ICD-10-CM | POA: Insufficient documentation

## 2013-08-09 DIAGNOSIS — Z79899 Other long term (current) drug therapy: Secondary | ICD-10-CM | POA: Insufficient documentation

## 2013-08-09 DIAGNOSIS — R109 Unspecified abdominal pain: Secondary | ICD-10-CM

## 2013-08-09 LAB — COMPREHENSIVE METABOLIC PANEL
ALK PHOS: 136 U/L — AB (ref 39–117)
ALT: 14 U/L (ref 0–35)
AST: 18 U/L (ref 0–37)
Albumin: 3.6 g/dL (ref 3.5–5.2)
BILIRUBIN TOTAL: 0.5 mg/dL (ref 0.3–1.2)
BUN: 9 mg/dL (ref 6–23)
CO2: 24 meq/L (ref 19–32)
Calcium: 9.5 mg/dL (ref 8.4–10.5)
Chloride: 100 mEq/L (ref 96–112)
Creatinine, Ser: 0.68 mg/dL (ref 0.50–1.10)
GLUCOSE: 112 mg/dL — AB (ref 70–99)
POTASSIUM: 4.3 meq/L (ref 3.7–5.3)
SODIUM: 137 meq/L (ref 137–147)
TOTAL PROTEIN: 7.7 g/dL (ref 6.0–8.3)

## 2013-08-09 LAB — CBC WITH DIFFERENTIAL/PLATELET
Basophils Absolute: 0 10*3/uL (ref 0.0–0.1)
Basophils Relative: 1 % (ref 0–1)
EOS ABS: 0.3 10*3/uL (ref 0.0–0.7)
Eosinophils Relative: 5 % (ref 0–5)
HCT: 43.8 % (ref 36.0–46.0)
HEMOGLOBIN: 14.6 g/dL (ref 12.0–15.0)
LYMPHS ABS: 1.8 10*3/uL (ref 0.7–4.0)
LYMPHS PCT: 28 % (ref 12–46)
MCH: 28.5 pg (ref 26.0–34.0)
MCHC: 33.3 g/dL (ref 30.0–36.0)
MCV: 85.5 fL (ref 78.0–100.0)
MONOS PCT: 5 % (ref 3–12)
Monocytes Absolute: 0.3 10*3/uL (ref 0.1–1.0)
NEUTROS PCT: 62 % (ref 43–77)
Neutro Abs: 4 10*3/uL (ref 1.7–7.7)
Platelets: 230 10*3/uL (ref 150–400)
RBC: 5.12 MIL/uL — AB (ref 3.87–5.11)
RDW: 14 % (ref 11.5–15.5)
WBC: 6.5 10*3/uL (ref 4.0–10.5)

## 2013-08-09 LAB — URINALYSIS, ROUTINE W REFLEX MICROSCOPIC
BILIRUBIN URINE: NEGATIVE
Glucose, UA: NEGATIVE mg/dL
HGB URINE DIPSTICK: NEGATIVE
Ketones, ur: NEGATIVE mg/dL
Leukocytes, UA: NEGATIVE
NITRITE: NEGATIVE
PH: 6 (ref 5.0–8.0)
Protein, ur: NEGATIVE mg/dL
SPECIFIC GRAVITY, URINE: 1.019 (ref 1.005–1.030)
Urobilinogen, UA: 1 mg/dL (ref 0.0–1.0)

## 2013-08-09 MED ORDER — LORAZEPAM 1 MG PO TABS
1.0000 mg | ORAL_TABLET | Freq: Once | ORAL | Status: AC
  Administered 2013-08-09: 1 mg via ORAL
  Filled 2013-08-09: qty 1

## 2013-08-09 MED ORDER — IOHEXOL 300 MG/ML  SOLN
50.0000 mL | Freq: Once | INTRAMUSCULAR | Status: AC | PRN
  Administered 2013-08-09: 50 mL via ORAL

## 2013-08-09 MED ORDER — ONDANSETRON HCL 4 MG/2ML IJ SOLN
4.0000 mg | Freq: Once | INTRAMUSCULAR | Status: AC
  Administered 2013-08-09: 4 mg via INTRAVENOUS
  Filled 2013-08-09: qty 2

## 2013-08-09 MED ORDER — MORPHINE SULFATE 4 MG/ML IJ SOLN
4.0000 mg | Freq: Once | INTRAMUSCULAR | Status: AC
  Administered 2013-08-09: 4 mg via INTRAVENOUS
  Filled 2013-08-09: qty 1

## 2013-08-09 MED ORDER — SODIUM CHLORIDE 0.9 % IV BOLUS (SEPSIS)
1000.0000 mL | Freq: Once | INTRAVENOUS | Status: AC
  Administered 2013-08-09: 1000 mL via INTRAVENOUS

## 2013-08-09 MED ORDER — IOHEXOL 300 MG/ML  SOLN
100.0000 mL | Freq: Once | INTRAMUSCULAR | Status: AC | PRN
  Administered 2013-08-09: 100 mL via INTRAVENOUS

## 2013-08-09 NOTE — ED Notes (Signed)
Patient with c/o abdominal pain that started three hours PTA - N/V + diarrhea, per patient Patient states that pain is similar to pain that she experienced due to an ovarian cyst

## 2013-08-09 NOTE — ED Provider Notes (Signed)
CSN: [id number]     Arrival date & time 08/09/13  0248 History   First MD Initiated Contact with Patient 08/09/13 [phone]     Chief Complaint  Patient presents with  . Abdominal Pain   HPI  History provided by the patient. The patient is a 48 year old female with past history of hysterectomy, ovarian cysts who presents with complaints of lower abdominal pain. Pain began around midnight and is described as a sharp stabbing pain to her right lower abdomen radiating through her entire abdomen. She reports having some associated bloating sensation throughout her entire abdomen with slight nausea. She also reports having some chills but denies any fever. Denies any vomiting, diarrhea or recent constipation. She tells me this pain feels more severe and different than previous ovarian cysts although it does seem low. She denies having any urinary symptoms. No dysuria, hematuria or pregnancy. No vaginal bleeding or vaginal discharge.    Past Medical History  Diagnosis Date  . Panic attacks   . Hypoglycemic reaction   . Ovarian cyst    History reviewed. No pertinent past surgical history. No family history on file. History  Substance Use Topics  . Smoking status: Not on file  . Smokeless tobacco: Not on file  . Alcohol Use: No   OB History   Grav Para Term Preterm Abortions TAB SAB Ect Mult Living                 Review of Systems  Constitutional: Positive for chills. Negative for fever.  Respiratory: Negative for shortness of breath.   Cardiovascular: Negative for chest pain.  Gastrointestinal: Positive for nausea and abdominal pain. Negative for vomiting, diarrhea and constipation.  Genitourinary: Negative for dysuria, frequency, hematuria, flank pain, vaginal bleeding and vaginal discharge.  All other systems reviewed and are negative.      Allergies  Amoxicillin; Celebrex; Citalopram hydrobromide; and Clarithromycin  Home Medications   Current Outpatient Rx  Name  Route  Sig   Dispense  Refill  . calcium carbonate (TUMS - DOSED IN MG ELEMENTAL CALCIUM) 500 MG chewable tablet   Oral   Chew 1 tablet by mouth daily as needed for indigestion or heartburn.         Marland Kitchen. LORazepam (ATIVAN) 2 MG tablet   Oral   Take 1 mg by mouth every 8 (eight) hours as needed for anxiety. For anxiety.         . sertraline (ZOLOFT) 100 MG tablet   Oral   Take 100 mg by mouth daily.          BP 157/87  Pulse 92  Temp(Src) 98 F (36.7 C) (Oral)  Resp 20  Ht 5\' 5"  (1.651 m)  SpO2 98% Physical Exam  Nursing note and vitals reviewed. Constitutional: She is oriented to person, place, and time. She appears well-developed and well-nourished. No distress.  HENT:  Head: Normocephalic.  Cardiovascular: Normal rate and regular rhythm.   Pulmonary/Chest: Effort normal and breath sounds normal.  Abdominal: Soft. She exhibits no distension. There is tenderness in the right lower quadrant. There is rebound.  Diffuse abdominal tenderness. This does seem slightly greater in the right lower quadrant. Patient reports some increased pain with rebound. No Rovsing's. No significant change in pain over McBurney's point.  No guarding.  Neurological: She is alert and oriented to person, place, and time.  Skin: Skin is warm and dry. No rash noted.  Psychiatric: She has a normal mood and affect. Her behavior is normal.  ED Course  Procedures   DIAGNOSTIC STUDIES: Oxygen Saturation is 99% on room air.    COORDINATION OF CARE:  Nursing notes reviewed. Vital signs reviewed. Initial pt interview and examination performed.   3:33 AM-patient seen and evaluated. She appears well no acute distress. Does appear in mild discomfort. Discussed work up plan with pt at bedside, which includes lab testing and CT. Pt agrees with plan.  Lab testing is unremarkable. CT scan does not show signs of acute appendicitis. There are bilateral ovarian cysts. No other concerning features on exam. Patient reports  having significant improvement after pain medications. Denies any pains or discomfort. Discussed the findings on the CT scan. We also discussed options for further evaluation of her ovarian cysts with ultrasound however patient wishes to followup with her doctors. She'll be discharged at this time. Return precautions given.   Results for orders placed during the hospital encounter of 08/09/13  URINALYSIS, ROUTINE W REFLEX MICROSCOPIC      Result Value Ref Range   Color, Urine YELLOW  YELLOW   APPearance CLOUDY (*) CLEAR   Specific Gravity, Urine 1.019  1.005 - 1.030   pH 6.0  5.0 - 8.0   Glucose, UA NEGATIVE  NEGATIVE mg/dL   Hgb urine dipstick NEGATIVE  NEGATIVE   Bilirubin Urine NEGATIVE  NEGATIVE   Ketones, ur NEGATIVE  NEGATIVE mg/dL   Protein, ur NEGATIVE  NEGATIVE mg/dL   Urobilinogen, UA 1.0  0.0 - 1.0 mg/dL   Nitrite NEGATIVE  NEGATIVE   Leukocytes, UA NEGATIVE  NEGATIVE  CBC WITH DIFFERENTIAL      Result Value Ref Range   WBC 6.5  4.0 - 10.5 K/uL   RBC 5.12 (*) 3.87 - 5.11 MIL/uL   Hemoglobin 14.6  12.0 - 15.0 g/dL   HCT 16.1  09.6 - 04.5 %   MCV 85.5  78.0 - 100.0 fL   MCH 28.5  26.0 - 34.0 pg   MCHC 33.3  30.0 - 36.0 g/dL   RDW 40.9  81.1 - 91.4 %   Platelets 230  150 - 400 K/uL   Neutrophils Relative % 62  43 - 77 %   Neutro Abs 4.0  1.7 - 7.7 K/uL   Lymphocytes Relative 28  12 - 46 %   Lymphs Abs 1.8  0.7 - 4.0 K/uL   Monocytes Relative 5  3 - 12 %   Monocytes Absolute 0.3  0.1 - 1.0 K/uL   Eosinophils Relative 5  0 - 5 %   Eosinophils Absolute 0.3  0.0 - 0.7 K/uL   Basophils Relative 1  0 - 1 %   Basophils Absolute 0.0  0.0 - 0.1 K/uL  COMPREHENSIVE METABOLIC PANEL      Result Value Ref Range   Sodium 137  137 - 147 mEq/L   Potassium 4.3  3.7 - 5.3 mEq/L   Chloride 100  96 - 112 mEq/L   CO2 24  19 - 32 mEq/L   Glucose, Bld 112 (*) 70 - 99 mg/dL   BUN 9  6 - 23 mg/dL   Creatinine, Ser 7.82  0.50 - 1.10 mg/dL   Calcium 9.5  8.4 - 95.6 mg/dL   Total Protein  7.7  6.0 - 8.3 g/dL   Albumin 3.6  3.5 - 5.2 g/dL   AST 18  0 - 37 U/L   ALT 14  0 - 35 U/L   Alkaline Phosphatase 136 (*) 39 - 117 U/L   Total Bilirubin  0.5  0.3 - 1.2 mg/dL   GFR calc non Af Amer >90  >90 mL/min   GFR calc Af Amer >90  >90 mL/min      Imaging Review Ct Abdomen Pelvis Wo Contrast  08/09/2013   CLINICAL DATA Abdominal pain, nausea, vomiting and diarrhea. Pain is similar to prior ovarian cyst. Evaluate for appendicitis.  EXAM CT ABDOMEN AND PELVIS WITHOUT CONTRAST  TECHNIQUE Multidetector CT imaging of the abdomen and pelvis was performed following the standard protocol without intravenous contrast. Patient did not wish to receive intravenous contrast.  COMPARISON US TRANSVAGINAL NON-OB dated 02/17/2008; CT ABDOMEN W/O CM dated 11/13/2005  FINDINGS Included view of the lung bases are clear. The visualized heart and pericardium are unremarkable. Air distended distal esophagus versus small hiatal hernia.  Kidneys are orthotopic, demonstrating normal size and morphology without nephrolithiasis, hydronephrosis; limited assessment for renal masses on this nonenhanced examination. The unopacified ureters are normal in course and caliber. Urinary bladder is partially distended and unremarkable.  The liver, gallbladder, pancreas and adrenal glands are unremarkable for this non-contrast examination. No splenic lesion on this non enhanced examination, the spleen appears normal.  The stomach, small and large bowel are normal in course and caliber without inflammatory changes, a small amount of enteric contrast is seen in the proximal small bowel. Normal appendix. No intraperitoneal free fluid nor free air.  Great vessels are normal in course and caliber. No lymphadenopathy by CT size criteria. Small inguinal lymph nodes may be reactive. Status post hysterectomy. Similar appearance of 3.5 cm left adnexal cyst. 14 mm right adnexal cyst. The soft tissues and included osseous structures are  nonsuspicious. Mild broad levoscoliosis, severe L5-S1 degenerative disc disease and superimposed facet arthropathy result in severe left greater than right L5-S1 neural foraminal narrowing, progressed from prior examination.  IMPRESSION No CT findings of appendicitis nor acute intra-abdominal or pelvic process.  Bilateral adnexal cysts, 14 mm on the right and 3.5 cm on the left (similar on the left). Status post hysterectomy.  SIGNATURE  Electronically Signed   By: Awilda Metro   On: 08/09/2013 05:45     EKG Interpretation None      MDM   Final diagnoses:  Abdominal pain  Ovarian cyst       Angus Seller, PA-C 08/09/13 2327

## 2013-08-09 NOTE — Discharge Instructions (Signed)
Your lab testing and CT scan did not show any signs for an emergent cause severe pains today. Your CT scan did show a small ovarian cysts on both of your ovaries. At this time your providers feel that you may return home and followup with your doctors.    Abdominal Pain, Women Abdominal (stomach, pelvic, or belly) pain can be caused by many things. It is important to tell your doctor:  The location of the pain.  Does it come and go or is it present all the time?  Are there things that start the pain (eating certain foods, exercise)?  Are there other symptoms associated with the pain (fever, nausea, vomiting, diarrhea)? All of this is helpful to know when trying to find the cause of the pain. CAUSES   Stomach: virus or bacteria infection, or ulcer.  Intestine: appendicitis (inflamed appendix), regional ileitis (Crohn's disease), ulcerative colitis (inflamed colon), irritable bowel syndrome, diverticulitis (inflamed diverticulum of the colon), or cancer of the stomach or intestine.  Gallbladder disease or stones in the gallbladder.  Kidney disease, kidney stones, or infection.  Pancreas infection or cancer.  Fibromyalgia (pain disorder).  Diseases of the female organs:  Uterus: fibroid (non-cancerous) tumors or infection.  Fallopian tubes: infection or tubal pregnancy.  Ovary: cysts or tumors.  Pelvic adhesions (scar tissue).  Endometriosis (uterus lining tissue growing in the pelvis and on the pelvic organs).  Pelvic congestion syndrome (female organs filling up with blood just before the menstrual period).  Pain with the menstrual period.  Pain with ovulation (producing an egg).  Pain with an IUD (intrauterine device, birth control) in the uterus.  Cancer of the female organs.  Functional pain (pain not caused by a disease, may improve without treatment).  Psychological pain.  Depression. DIAGNOSIS  Your doctor will decide the seriousness of your pain by doing  an examination.  Blood tests.  X-rays.  Ultrasound.  CT scan (computed tomography, special type of X-ray).  MRI (magnetic resonance imaging).  Cultures, for infection.  Barium enema (dye inserted in the large intestine, to better view it with X-rays).  Colonoscopy (looking in intestine with a lighted tube).  Laparoscopy (minor surgery, looking in abdomen with a lighted tube).  Major abdominal exploratory surgery (looking in abdomen with a large incision). TREATMENT  The treatment will depend on the cause of the pain.   Many cases can be observed and treated at home.  Over-the-counter medicines recommended by your caregiver.  Prescription medicine.  Antibiotics, for infection.  Birth control pills, for painful periods or for ovulation pain.  Hormone treatment, for endometriosis.  Nerve blocking injections.  Physical therapy.  Antidepressants.  Counseling with a psychologist or psychiatrist.  Minor or major surgery. HOME CARE INSTRUCTIONS   Do not take laxatives, unless directed by your caregiver.  Take over-the-counter pain medicine only if ordered by your caregiver. Do not take aspirin because it can cause an upset stomach or bleeding.  Try a clear liquid diet (broth or water) as ordered by your caregiver. Slowly move to a bland diet, as tolerated, if the pain is related to the stomach or intestine.  Have a thermometer and take your temperature several times a day, and record it.  Bed rest and sleep, if it helps the pain.  Avoid sexual intercourse, if it causes pain.  Avoid stressful situations.  Keep your follow-up appointments and tests, as your caregiver orders.  If the pain does not go away with medicine or surgery, you may try:  Acupuncture.  Relaxation exercises (yoga, meditation).  Group therapy.  Counseling. SEEK MEDICAL CARE IF:   You notice certain foods cause stomach pain.  Your home care treatment is not helping your pain.  You  need stronger pain medicine.  You want your IUD removed.  You feel faint or lightheaded.  You develop nausea and vomiting.  You develop a rash.  You are having side effects or an allergy to your medicine. SEEK IMMEDIATE MEDICAL CARE IF:   Your pain does not go away or gets worse.  You have a fever.  Your pain is felt only in portions of the abdomen. The right side could possibly be appendicitis. The left lower portion of the abdomen could be colitis or diverticulitis.  You are passing blood in your stools (bright red or black tarry stools, with or without vomiting).  You have blood in your urine.  You develop chills, with or without a fever.  You pass out. MAKE SURE YOU:   Understand these instructions.  Will watch your condition.  Will get help right away if you are not doing well or get worse. Document Released: 03/15/2007 Document Revised: 08/10/2011 Document Reviewed: 04/04/2009 [facility] Patient Information 2014 [place], [place].    Ovarian Cyst An ovarian cyst is a fluid-filled sac that forms on an ovary. The ovaries are small organs that produce eggs in women. Various types of cysts can form on the ovaries. Most are not cancerous. Many do not cause problems, and they often go away on their own. Some may cause symptoms and require treatment. Common types of ovarian cysts include:  Functional cysts These cysts may occur every month during the menstrual cycle. This is normal. The cysts usually go away with the next menstrual cycle if the woman does not get pregnant. Usually, there are no symptoms with a functional cyst.  Endometrioma cysts These cysts form from the tissue that lines the uterus. They are also called "chocolate cysts" because they become filled with blood that turns brown. This type of cyst can cause pain in the lower abdomen during intercourse and with your menstrual period.  Cystadenoma cysts This type develops from the cells on the outside of the  ovary. These cysts can get very big and cause lower abdomen pain and pain with intercourse. This type of cyst can twist on itself, cut off its blood supply, and cause severe pain. It can also easily rupture and cause a lot of pain.  Dermoid cysts This type of cyst is sometimes found in both ovaries. These cysts may contain different kinds of body tissue, such as skin, teeth, hair, or cartilage. They usually do not cause symptoms unless they get very big.  Theca lutein cysts These cysts occur when too much of a certain hormone (human chorionic gonadotropin) is produced and overstimulates the ovaries to produce an egg. This is most common after procedures used to assist with the conception of a baby (in vitro fertilization). CAUSES   Fertility drugs can cause a condition in which multiple large cysts are formed on the ovaries. This is called ovarian hyperstimulation syndrome.  A condition called polycystic ovary syndrome can cause hormonal imbalances that can lead to nonfunctional ovarian cysts. SIGNS AND SYMPTOMS  Many ovarian cysts do not cause symptoms. If symptoms are present, they may include:  Pelvic pain or pressure.  Pain in the lower abdomen.  Pain during sexual intercourse.  Increasing girth (swelling) of the abdomen.  Abnormal menstrual periods.  Increasing pain with menstrual periods.  Stopping having menstrual periods without being pregnant. DIAGNOSIS  These cysts are commonly found during a routine or annual pelvic exam. Tests may be ordered to find out more about the cyst. These tests may include:  Ultrasound.  X-ray of the pelvis.  CT scan.  MRI.  Blood tests. TREATMENT  Many ovarian cysts go away on their own without treatment. Your health care provider may want to check your cyst regularly for 2 3 months to see if it changes. For women in menopause, it is particularly important to monitor a cyst closely because of the higher rate of ovarian cancer in menopausal  women. When treatment is needed, it may include any of the following:  A procedure to drain the cyst (aspiration). This may be done using a long needle and ultrasound. It can also be done through a laparoscopic procedure. This involves using a thin, lighted tube with a tiny camera on the end (laparoscope) inserted through a small incision.  Surgery to remove the whole cyst. This may be done using laparoscopic surgery or an open surgery involving a larger incision in the lower abdomen.  Hormone treatment or birth control pills. These methods are sometimes used to help dissolve a cyst. HOME CARE INSTRUCTIONS   Only take over-the-counter or prescription medicines as directed by your health care provider.  Follow up with your health care provider as directed.  Get regular pelvic exams and Pap tests. SEEK MEDICAL CARE IF:   Your periods are late, irregular, or painful, or they stop.  Your pelvic pain or abdominal pain does not go away.  Your abdomen becomes larger or swollen.  You have pressure on your bladder or trouble emptying your bladder completely.  You have pain during sexual intercourse.  You have feelings of fullness, pressure, or discomfort in your stomach.  You lose weight for no apparent reason.  You feel generally ill.  You become constipated.  You lose your appetite.  You develop acne.  You have an increase in body and facial hair.  You are gaining weight, without changing your exercise and eating habits.  You think you are pregnant. SEEK IMMEDIATE MEDICAL CARE IF:   You have increasing abdominal pain.  You feel sick to your stomach (nauseous), and you throw up (vomit).  You develop a fever that comes on suddenly.  You have abdominal pain during a bowel movement.  Your menstrual periods become heavier than usual. Document Released: 05/18/2005 Document Revised: 03/08/2013 Document Reviewed: 01/23/2013 [facility] Patient Information 2014 [place],  [place].

## 2013-08-09 NOTE — ED Provider Notes (Signed)
Medical screening examination/treatment/procedure(s) were performed by non-physician practitioner and as supervising physician I was immediately available for consultation/collaboration.   Sammye Staff, MD 08/09/13 2357 

## 2013-08-09 NOTE — ED Notes (Signed)
Patient transported to CT 

## 2013-08-09 NOTE — ED Notes (Signed)
MD at bedside. 

## 2013-08-24 ENCOUNTER — Ambulatory Visit: Payer: Self-pay | Admitting: Obstetrics

## 2013-08-25 ENCOUNTER — Ambulatory Visit: Payer: Self-pay | Admitting: Obstetrics

## 2015-07-21 IMAGING — CT CT ABD-PELV W/O CM
1 of 2 series · 15 of 32 positions shown, 19 images · IV contrast (100 ML OMNI 300)
Comparison: none

[Series 2: abd/pel with · axial · 0.67mm/px · z∈[+1293,+1653]mm · 15 of 83 slices shown, 19 images]
[im 7/83  soft-tissue]
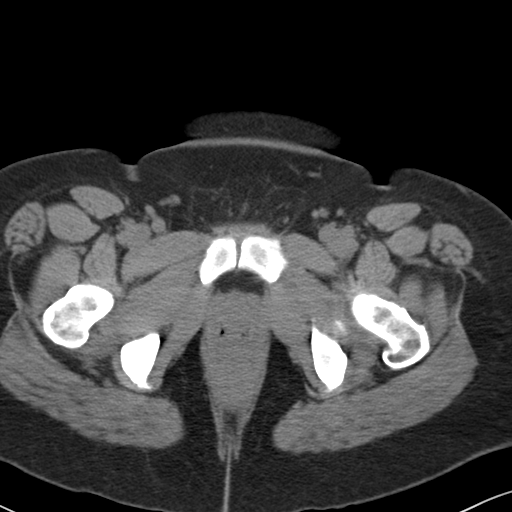
[im 7/83  bone]
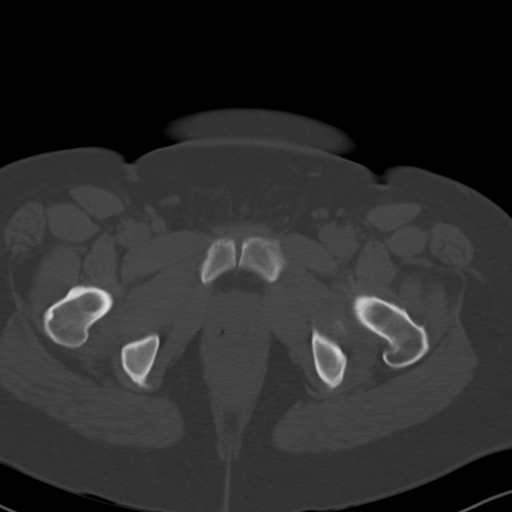
[im 13/83  soft-tissue]
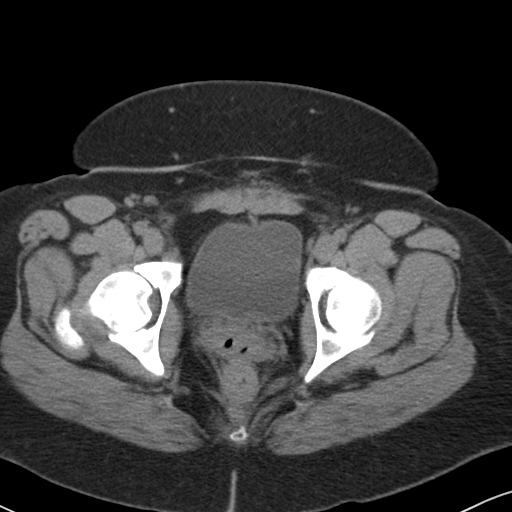
[im 19/83  soft-tissue]
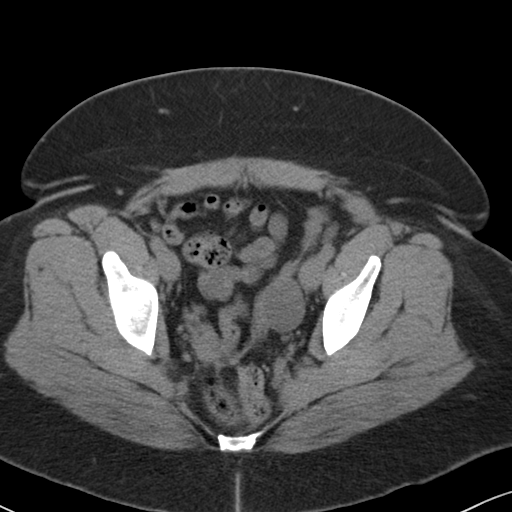
[im 25/83  soft-tissue]
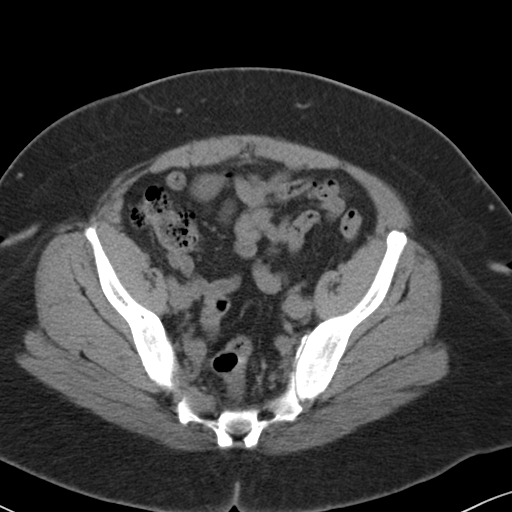
[im 31/83  soft-tissue]
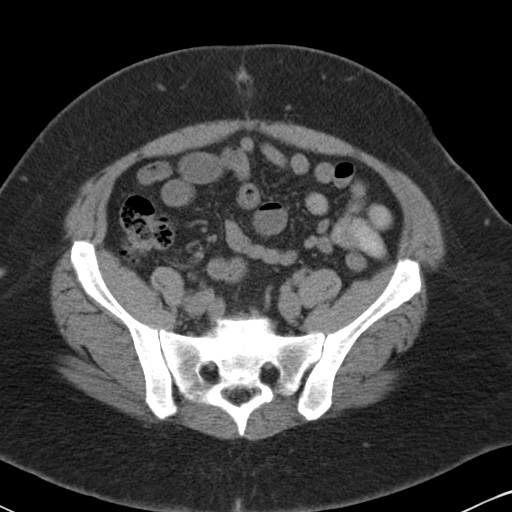
[im 37/83  soft-tissue]
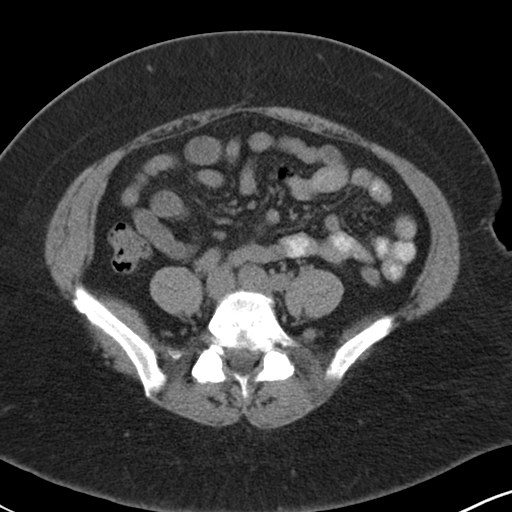
[im 43/83  soft-tissue]
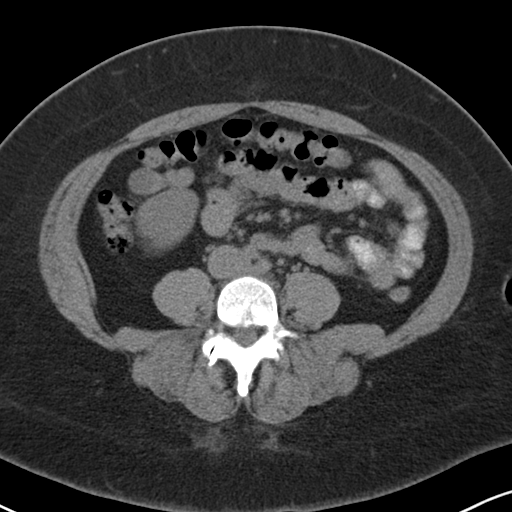
[im 49/83  soft-tissue]
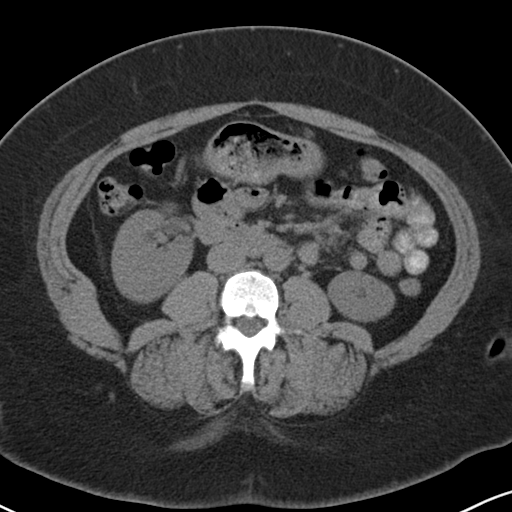
[im 55/83  soft-tissue]
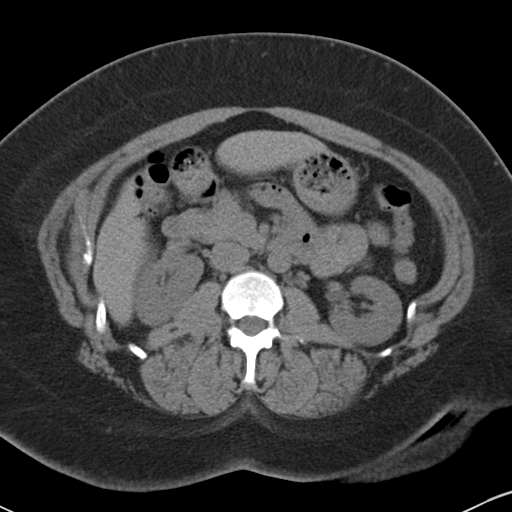
[im 55/83  bone]
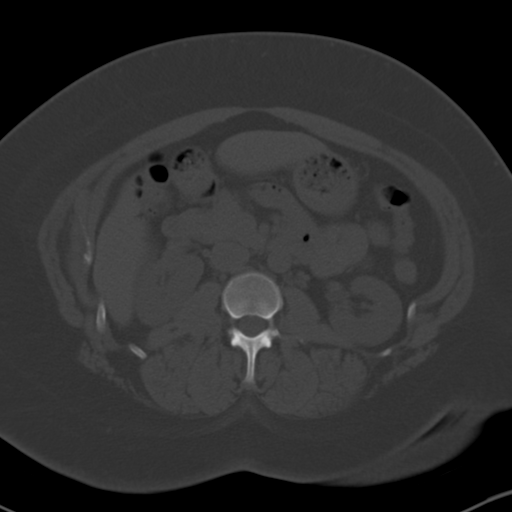
[im 61/83  soft-tissue]
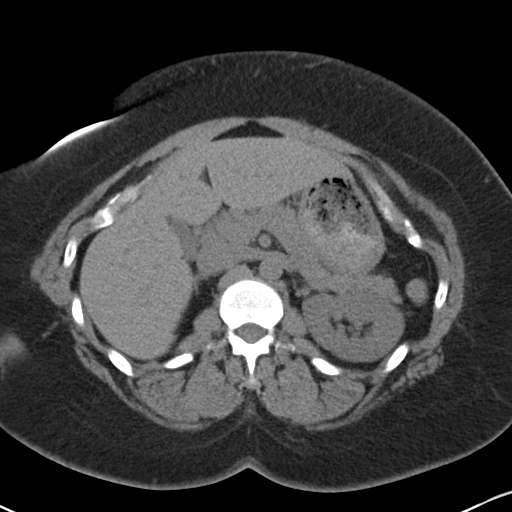
[im 67/83  soft-tissue]
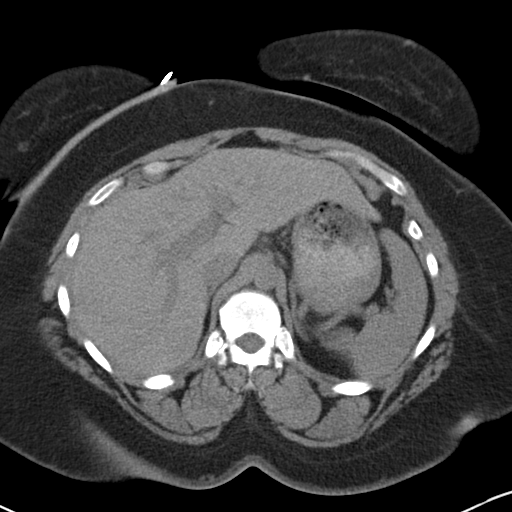
[im 70/83  lung]
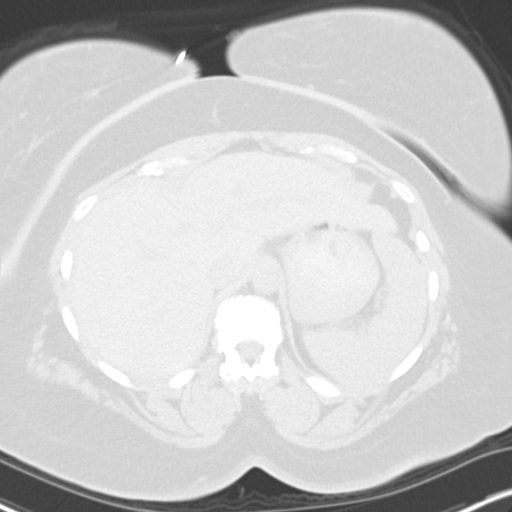
[im 73/83  soft-tissue]
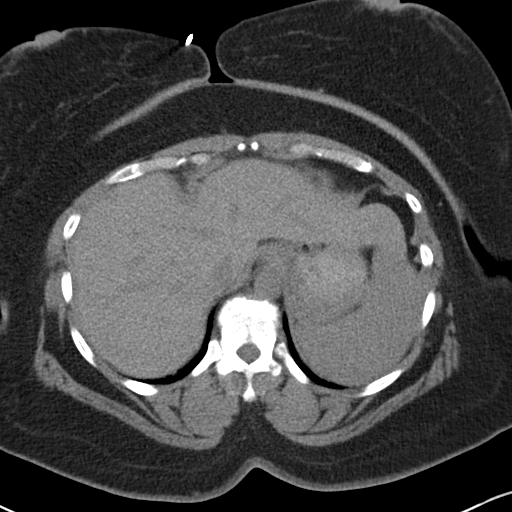
[im 73/83  lung]
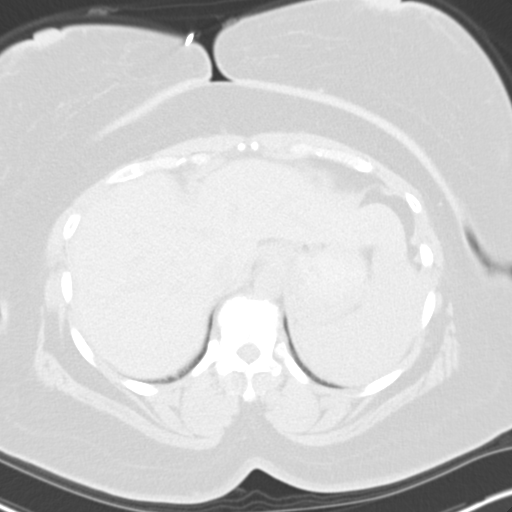
[im 76/83  lung]
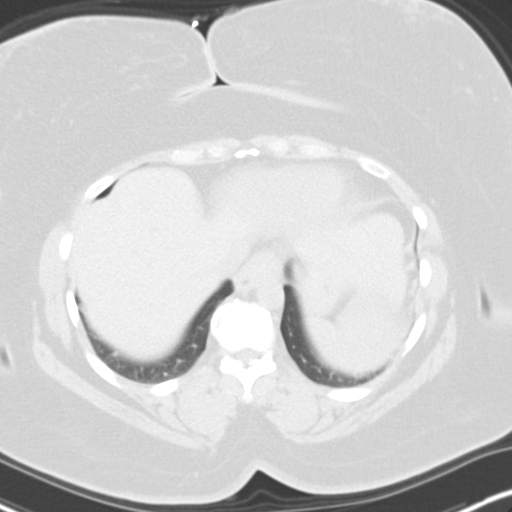
[im 79/83  soft-tissue]
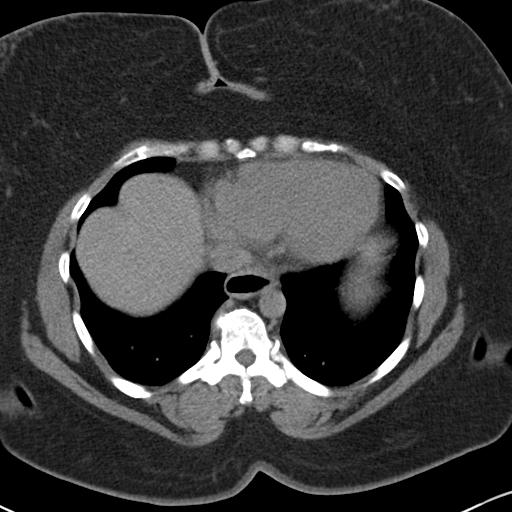
[im 79/83  lung]
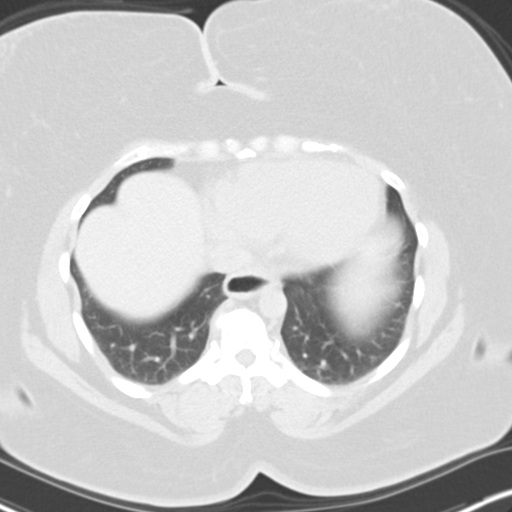

[15 of 32 positions shown; findings below may reference images not displayed]

CLINICAL DATA
Abdominal pain, nausea, vomiting and diarrhea. Pain is similar to
prior ovarian cyst. Evaluate for appendicitis.

EXAM
CT ABDOMEN AND PELVIS WITHOUT CONTRAST

TECHNIQUE
Multidetector CT imaging of the abdomen and pelvis was performed
following the standard protocol without intravenous contrast.
Patient did not wish to receive intravenous contrast.

COMPARISON
US TRANSVAGINAL NON-OB dated 02/17/2008; CT ABDOMEN W/O CM dated
11/13/2005

FINDINGS
Included view of the lung bases are clear. The visualized heart and
pericardium are unremarkable. Air distended distal esophagus versus
small hiatal hernia.

Kidneys are orthotopic, demonstrating normal size and morphology
without nephrolithiasis, hydronephrosis; limited assessment for
renal masses on this nonenhanced examination. The unopacified
ureters are normal in course and caliber. Urinary bladder is
partially distended and unremarkable.

The liver, gallbladder, pancreas and adrenal glands are unremarkable
for this non-contrast examination. No splenic lesion on this non
enhanced examination, the spleen appears normal.

The stomach, small and large bowel are normal in course and caliber
without inflammatory changes, a small amount of enteric contrast is
seen in the proximal small bowel. Normal appendix. No
intraperitoneal free fluid nor free air.

Great vessels are normal in course and caliber. No lymphadenopathy
by CT size criteria. Small inguinal lymph nodes may be reactive.
Status post hysterectomy. Similar appearance of 3.5 cm left adnexal
cyst. 14 mm right adnexal cyst. The soft tissues and included
osseous structures are nonsuspicious. Mild broad levoscoliosis,
severe L5-S1 degenerative disc disease and superimposed facet
arthropathy result in severe left greater than right L5-S1 neural
foraminal narrowing, progressed from prior examination.

IMPRESSION
No CT findings of appendicitis nor acute intra-abdominal or pelvic
process.

Bilateral adnexal cysts, 14 mm on the right and 3.5 cm on the left
(similar on the left). Status post hysterectomy.

SIGNATURE

  By: Kilanko Waba
